# Patient Record
Sex: Male | Born: 1995 | Race: White | Hispanic: No | Marital: Single | Smoking: Never smoker
Health system: Southern US, Community
[De-identification: ages and names within clinical notes are randomized; demographics above are authoritative.]

---
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
## 2016-05-21 HISTORY — PX: ORIF METACARPAL FRACTURE: SUR940

## 2016-06-11 ENCOUNTER — Ambulatory Visit
Admission: RE | Admit: 2016-06-11 | Discharge: 2016-06-11 | Disposition: A | Discharge status: Home / Self Care | Payer: 59 | Source: Ambulatory Visit | Attending: Family Medicine | Admitting: Family Medicine

## 2016-06-11 ENCOUNTER — Other Ambulatory Visit: Payer: Self-pay | Admitting: Family Medicine

## 2016-06-11 DIAGNOSIS — S62325A Displaced fracture of shaft of fourth metacarpal bone, left hand, initial encounter for closed fracture: Secondary | ICD-10-CM | POA: Diagnosis not present

## 2016-06-11 DIAGNOSIS — M79641 Pain in right hand: Secondary | ICD-10-CM | POA: Diagnosis present

## 2016-06-11 DIAGNOSIS — M7989 Other specified soft tissue disorders: Secondary | ICD-10-CM | POA: Insufficient documentation

## 2016-06-11 DIAGNOSIS — R52 Pain, unspecified: Secondary | ICD-10-CM

## 2016-06-11 DIAGNOSIS — X58XXXA Exposure to other specified factors, initial encounter: Secondary | ICD-10-CM | POA: Diagnosis not present

## 2016-07-01 ENCOUNTER — Ambulatory Visit
Admission: RE | Admit: 2016-07-01 | Discharge: 2016-07-01 | Disposition: A | Discharge status: Home / Self Care | Payer: 59 | Source: Ambulatory Visit | Attending: Family Medicine | Admitting: Family Medicine

## 2016-07-01 ENCOUNTER — Other Ambulatory Visit: Payer: Self-pay | Admitting: Family Medicine

## 2016-07-01 DIAGNOSIS — M79641 Pain in right hand: Secondary | ICD-10-CM | POA: Diagnosis present

## 2016-07-01 DIAGNOSIS — M7989 Other specified soft tissue disorders: Secondary | ICD-10-CM | POA: Diagnosis not present

## 2016-07-01 DIAGNOSIS — Z9889 Other specified postprocedural states: Secondary | ICD-10-CM | POA: Diagnosis not present

## 2016-07-01 DIAGNOSIS — R609 Edema, unspecified: Secondary | ICD-10-CM

## 2016-07-01 DIAGNOSIS — R52 Pain, unspecified: Secondary | ICD-10-CM

## 2016-08-01 ENCOUNTER — Other Ambulatory Visit: Payer: Self-pay | Admitting: Family Medicine

## 2016-08-01 ENCOUNTER — Ambulatory Visit
Admission: RE | Admit: 2016-08-01 | Discharge: 2016-08-01 | Disposition: A | Discharge status: Home / Self Care | Payer: 59 | Source: Ambulatory Visit | Attending: Family Medicine | Admitting: Family Medicine

## 2016-08-01 DIAGNOSIS — S62304D Unspecified fracture of fourth metacarpal bone, right hand, subsequent encounter for fracture with routine healing: Secondary | ICD-10-CM | POA: Insufficient documentation

## 2016-08-01 DIAGNOSIS — X58XXXD Exposure to other specified factors, subsequent encounter: Secondary | ICD-10-CM | POA: Diagnosis not present

## 2016-08-01 DIAGNOSIS — R52 Pain, unspecified: Secondary | ICD-10-CM

## 2016-08-22 ENCOUNTER — Encounter: Payer: Self-pay | Admitting: Family Medicine

## 2016-08-22 ENCOUNTER — Ambulatory Visit (INDEPENDENT_AMBULATORY_CARE_PROVIDER_SITE_OTHER): Payer: 59 | Admitting: Family Medicine

## 2016-08-22 VITALS — Temp 97.3°F

## 2016-08-22 DIAGNOSIS — B369 Superficial mycosis, unspecified: Secondary | ICD-10-CM

## 2016-08-22 MED ORDER — CLOTRIMAZOLE-BETAMETHASONE 1-0.05 % EX CREA: 1.0000 "application " | TOPICAL_CREAM | Freq: Two times a day (BID) | CUTANEOUS | 0 refills | Status: AC

## 2016-08-22 MED ORDER — FLUCONAZOLE 150 MG PO TABS: ORAL_TABLET | ORAL | 0 refills | Status: AC

## 2016-08-22 NOTE — Progress Notes (Signed)
Patient presents today with symptoms of itchiness to the left axilla area. Patient states that he has had the symptoms for the last few days. He does admit to sweating significantly with activity. He denies any other rash anywhere else. He denies the rash being painful or any discharge from the rash. He denies any new products such as deodorant or soap.  ROS: Negative except mentioned above.  Vitals as per Epic.  GENERAL: NAD RESP: CTA B CARD: RRR SKIN: Erythematous slightly raised rash in the left axilla area with some small pinpoint red lesions, no discharge from the site, no streaks NEURO: CN II-XII grossly intact   A/P: Skin Rash- appears to be fungal in nature, will prescribe Lotrisone, wash with unscented soap and pat dry area, avoid staying in sweat for long periods of time, will also treat with Diflucan orally weekly for 4 weeks given the extensive area in the left axilla area. Follow-up when necessary.

## 2016-10-25 ENCOUNTER — Encounter: Payer: Self-pay | Admitting: Family Medicine

## 2016-10-25 ENCOUNTER — Ambulatory Visit (INDEPENDENT_AMBULATORY_CARE_PROVIDER_SITE_OTHER): Payer: 59 | Admitting: Family Medicine

## 2016-10-25 VITALS — BP 135/75 | HR 66 | Temp 97.1°F | Resp 16

## 2016-10-25 DIAGNOSIS — B49 Unspecified mycosis: Secondary | ICD-10-CM

## 2016-10-25 NOTE — Progress Notes (Signed)
Patient presents today with symptoms of itchiness of both axillae. Patient states that he has had the symptoms for the last few days. He does admit to sweating significantly with activity. He denies any other rash anywhere else. He denies the rash being painful or any discharge from the rash. He denies any new products such as deodorant or soap. He was treated with Lotrisone and Diflucan last year which did help his symptoms. He denies any rash in the groin area or anywhere else.  ROS: Negative except mentioned above.  Vitals as per Epic.  GENERAL: NAD RESP: CTA B CARD: RRR SKIN: Slightly erythematous rash w/ a few small pinpoint red lesions in both axillae areas, no discharge from the site, no streaks NEURO: CN II-XII grossly intact   A/P: Skin Rash- appears to be fungal in nature, will prescribe Lotrisone, wash with unscented soap and pat dry area, avoid staying in sweat for long periods of time, if any areas look infected he should seek medical attention. Follow-up when necessary.

## 2017-07-30 IMAGING — CR DG HAND COMPLETE 3+V*R*
1 series · 3 of 3 positions shown · non-contrast
Comparison: 06/11/2016, 07/01/2016

CLINICAL DATA: Recheck healing following surgical fixation of
fourth metacarpal fracture

EXAM:
RIGHT HAND - COMPLETE 3+ VIEW

[Series 1: dg hand complete right · 0.14mm/px · 3 of 3 slices shown]
[im 1/3]
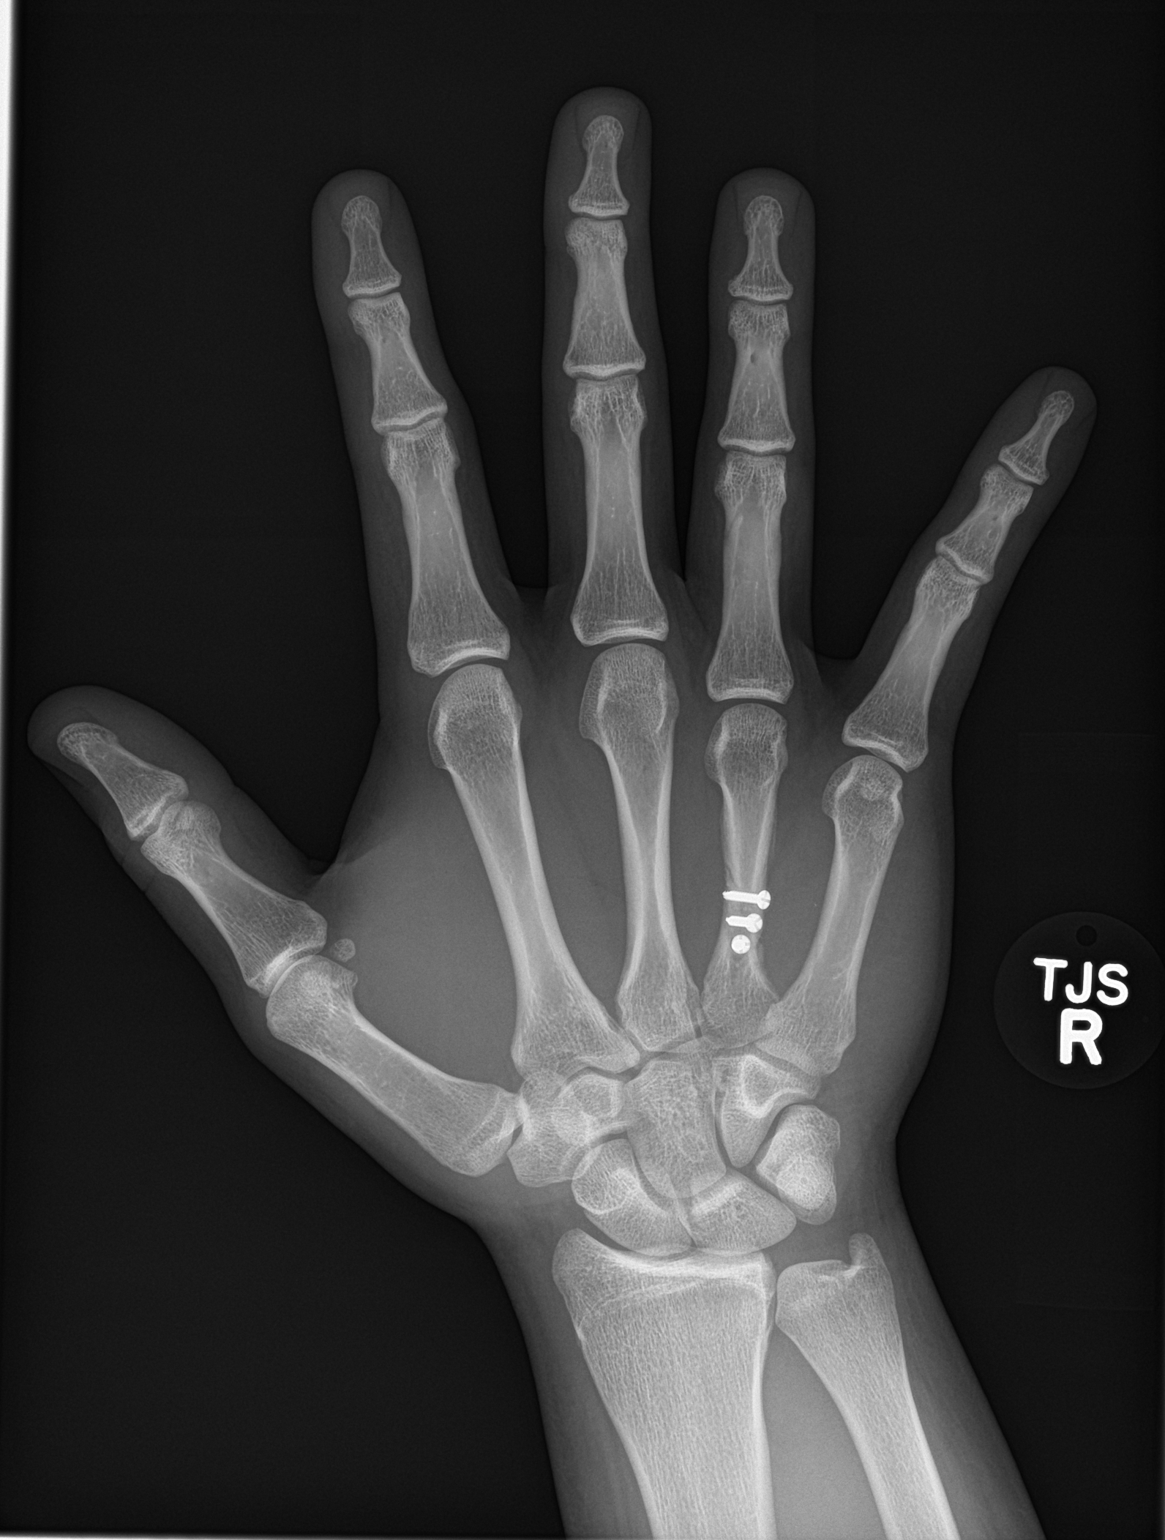
[im 2/3]
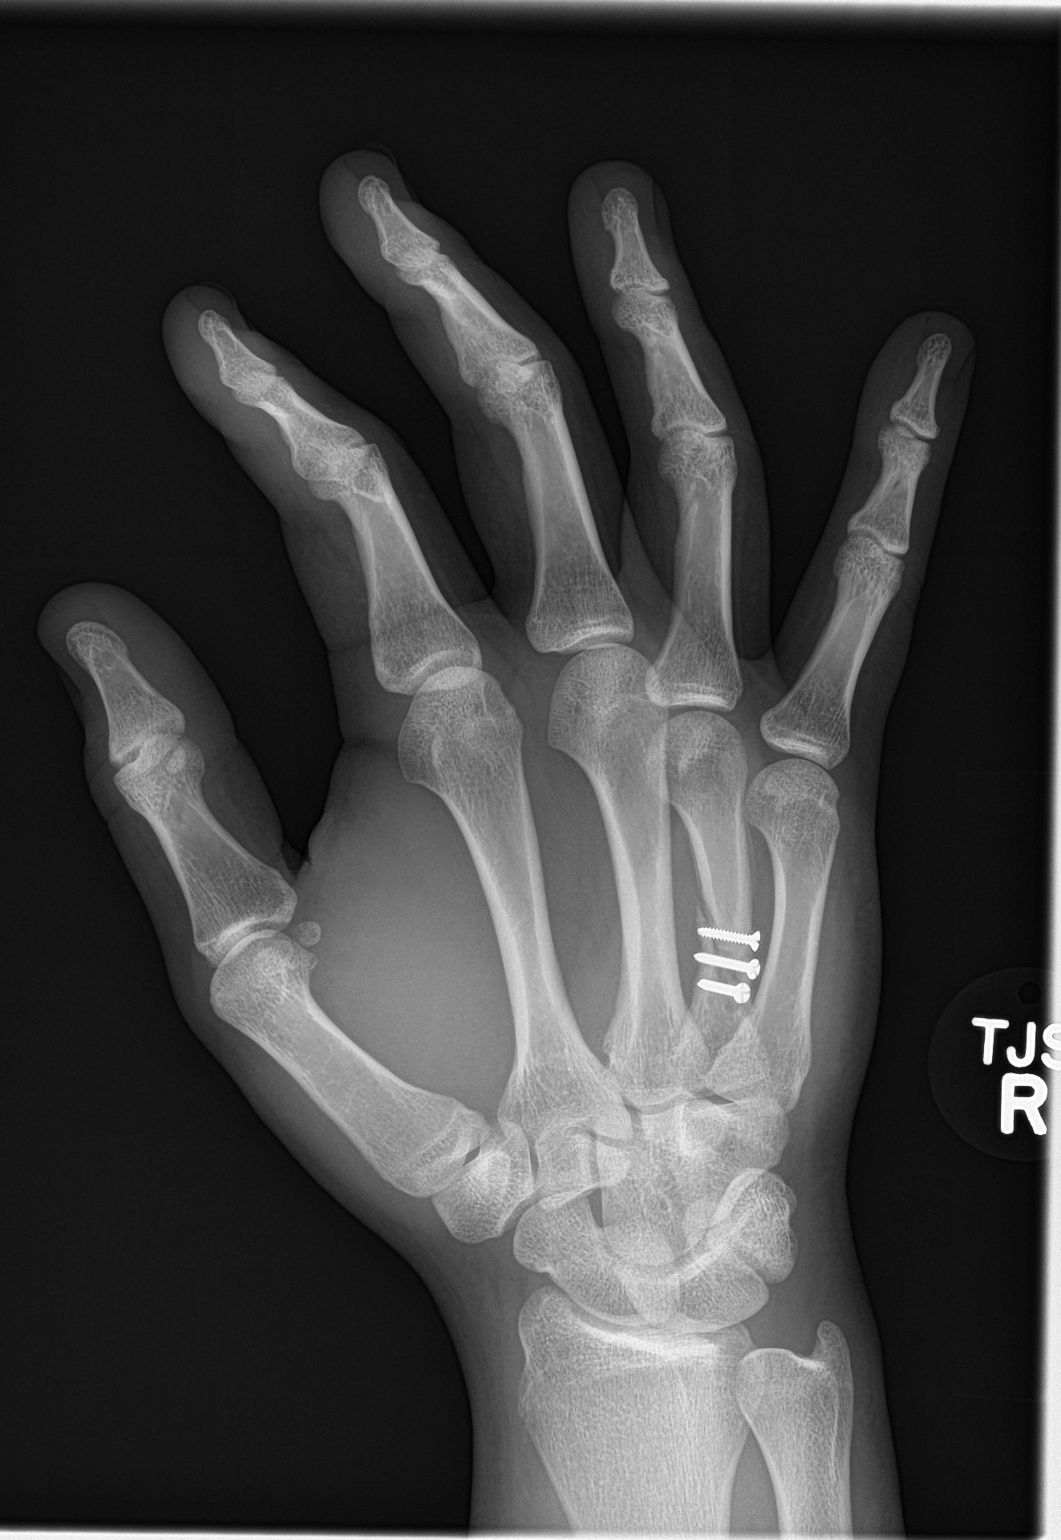
[im 3/3]
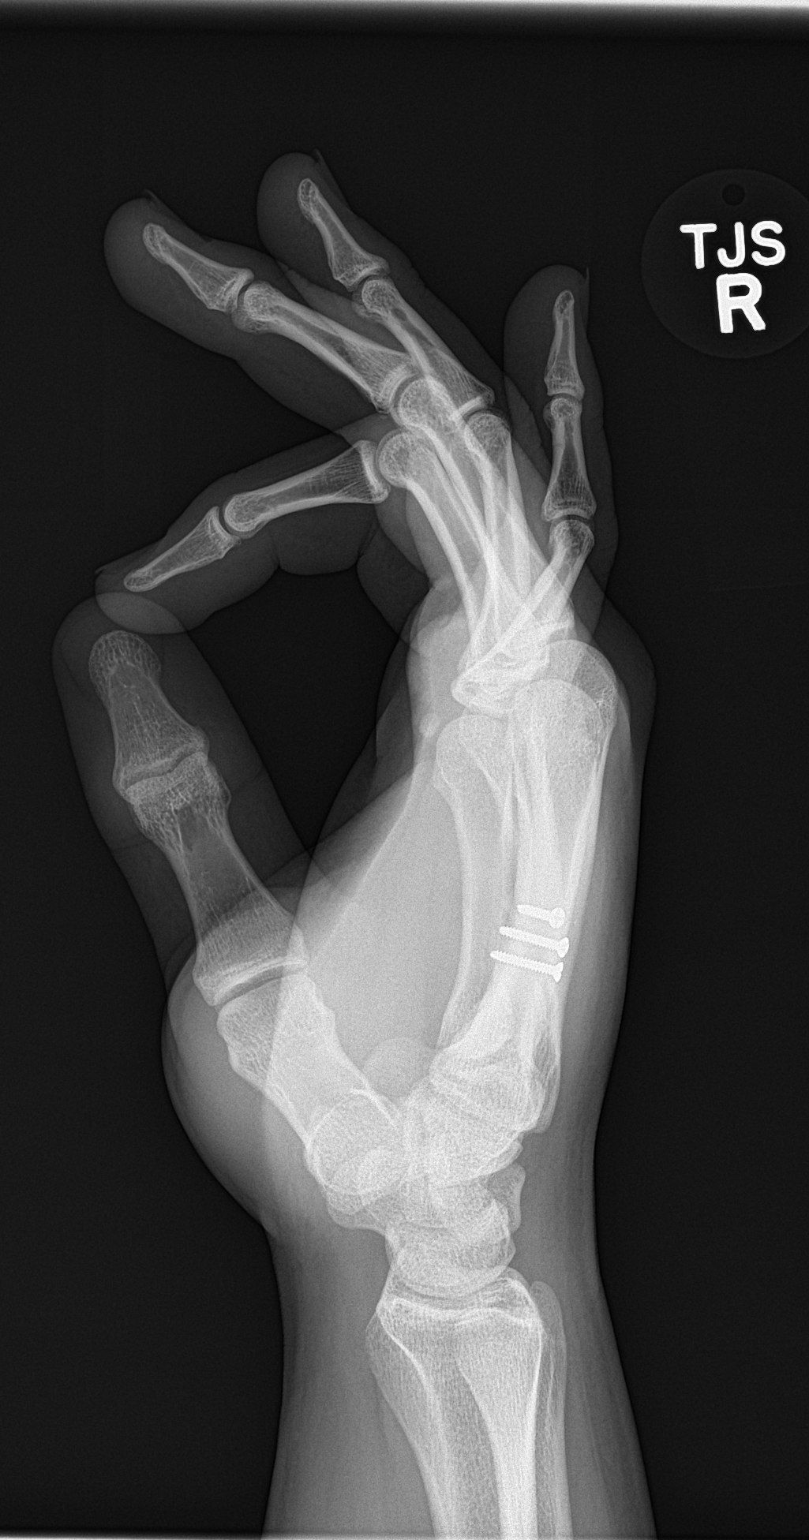

[3 of 3 positions shown; findings below may reference images not displayed]

FINDINGS: Prior fracture in the fourth metacarpal is again noted with 3
fixation screws. Mild callus formation is seen. The fracture line is
again noted and stable.
IMPRESSION: Surgically repaired fourth metacarpal fracture with mild callus
formation.

## 2018-01-08 ENCOUNTER — Ambulatory Visit (INDEPENDENT_AMBULATORY_CARE_PROVIDER_SITE_OTHER): Payer: 59 | Admitting: Family Medicine

## 2018-01-08 VITALS — BP 141/86 | HR 76 | Temp 99.2°F | Resp 14

## 2018-01-08 DIAGNOSIS — J029 Acute pharyngitis, unspecified: Secondary | ICD-10-CM

## 2018-01-08 DIAGNOSIS — R509 Fever, unspecified: Secondary | ICD-10-CM

## 2018-01-08 LAB — POCT INFLUENZA A/B
INFLUENZA A, POC: NEGATIVE
INFLUENZA B, POC: NEGATIVE

## 2018-01-08 LAB — POCT RAPID STREP A (OFFICE): RAPID STREP A SCREEN: POSITIVE — AB

## 2018-01-08 MED ORDER — AMOXICILLIN 500 MG PO CAPS: 500.0000 mg | ORAL_CAPSULE | Freq: Two times a day (BID) | ORAL | 0 refills | Status: AC

## 2018-01-08 NOTE — Progress Notes (Signed)
Patient presents today with symptoms of fever, headache, sore throat. He states that he has had symptoms for the last few days. He denies any abdominal pain, nausea, diarrhea, vomiting. He denies any cough or nasal congestion. He did experience night sweats last night.  ROS: Negative except mentioned above.  GENERAL: NAD HEENT: moderate pharyngeal erythema, no exudate, no erythema of TMs, mild right cervical LAD RESP: CTA B CARD: RRR ABD: positive bowel sounds, nontender,  no rebound or guarding, no organomegaly NEURO: CN II-XII grossly intact   A/P: Strep. Pharyngitis - rapid strep test was positive, flu test was negative, will treat patient with Amoxicillin, discussed taking Tylenol/Ibuprofen, rest,  hydration, seek medical attention if symptoms persist or worsen. No class or athletic activity for 2 days and/or if febrile.

## 2018-11-10 ENCOUNTER — Encounter: Payer: Self-pay | Admitting: Family Medicine

## 2018-11-10 ENCOUNTER — Ambulatory Visit (INDEPENDENT_AMBULATORY_CARE_PROVIDER_SITE_OTHER): Payer: 59 | Admitting: Family Medicine

## 2018-11-10 VITALS — BP 137/86 | HR 65 | Temp 98.2°F | Resp 14

## 2018-11-10 DIAGNOSIS — L989 Disorder of the skin and subcutaneous tissue, unspecified: Secondary | ICD-10-CM

## 2018-11-10 NOTE — Progress Notes (Signed)
Left before being seen.

## 2019-04-22 ENCOUNTER — Other Ambulatory Visit: Payer: Self-pay | Admitting: *Deleted

## 2019-04-22 DIAGNOSIS — Z20822 Contact with and (suspected) exposure to covid-19: Secondary | ICD-10-CM

## 2019-05-31 ENCOUNTER — Other Ambulatory Visit: Payer: Self-pay

## 2019-05-31 DIAGNOSIS — Z20822 Contact with and (suspected) exposure to covid-19: Secondary | ICD-10-CM

## 2019-05-31 NOTE — Progress Notes (Unsigned)
la
